# Patient Record
Sex: Female | Born: 1935 | Race: White | Hispanic: No | Marital: Married
Health system: Southern US, Community
[De-identification: ages and names within clinical notes are randomized; demographics above are authoritative.]

---

## 2006-12-13 ENCOUNTER — Ambulatory Visit (HOSPITAL_COMMUNITY): Admission: RE | Admit: 2006-12-13 | Discharge: 2006-12-14 | Payer: Self-pay | Admitting: Ophthalmology

## 2012-08-26 ENCOUNTER — Ambulatory Visit: Payer: Self-pay | Admitting: Unknown Physician Specialty

## 2012-08-26 LAB — APTT: Activated PTT: 23.9 secs (ref 23.6–35.9)

## 2012-08-26 LAB — URINALYSIS, COMPLETE
Nitrite: NEGATIVE
Protein: NEGATIVE
RBC,UR: 18 /HPF (ref 0–5)
Specific Gravity: 1.021 (ref 1.003–1.030)

## 2012-08-26 LAB — BASIC METABOLIC PANEL
Anion Gap: 18 — ABNORMAL HIGH (ref 7–16)
BUN: 21 mg/dL — ABNORMAL HIGH (ref 7–18)
Co2: 28 mmol/L (ref 21–32)
Creatinine: 0.71 mg/dL (ref 0.60–1.30)

## 2012-08-26 LAB — CBC
HCT: 40.3 % (ref 35.0–47.0)
HGB: 13.5 g/dL (ref 12.0–16.0)
MCH: 31.4 pg (ref 26.0–34.0)
MCHC: 33.4 g/dL (ref 32.0–36.0)

## 2012-08-26 LAB — PROTIME-INR
INR: 0.9
Prothrombin Time: 12.4 secs (ref 11.5–14.7)

## 2012-09-15 ENCOUNTER — Inpatient Hospital Stay: Payer: Self-pay | Admitting: Unknown Physician Specialty

## 2012-09-15 LAB — ELECTROLYTE PANEL
Chloride: 111 mmol/L — ABNORMAL HIGH (ref 98–107)
Co2: 30 mmol/L (ref 21–32)

## 2012-09-15 LAB — HEMOGLOBIN: HGB: 9.9 g/dL — ABNORMAL LOW (ref 12.0–16.0)

## 2012-09-16 LAB — BASIC METABOLIC PANEL
BUN: 12 mg/dL (ref 7–18)
Chloride: 107 mmol/L (ref 98–107)
Creatinine: 0.67 mg/dL (ref 0.60–1.30)
EGFR (Non-African Amer.): >60
Glucose: 205 mg/dL — ABNORMAL HIGH (ref 65–99)
Potassium: 4.2 mmol/L (ref 3.5–5.1)
Sodium: 138 mmol/L (ref 136–145)

## 2012-09-16 LAB — HEMOGLOBIN: HGB: 9.1 g/dL — ABNORMAL LOW (ref 12.0–16.0)

## 2012-09-17 LAB — CBC WITH DIFFERENTIAL/PLATELET
Basophil #: 0 10*3/uL (ref 0.0–0.1)
HCT: 23.9 % — ABNORMAL LOW (ref 35.0–47.0)
Lymphocyte #: 0.7 10*3/uL — ABNORMAL LOW (ref 1.0–3.6)
Lymphocyte %: 6.7 %
MCHC: 33.9 g/dL (ref 32.0–36.0)
Monocyte #: 0.8 x10 3/mm (ref 0.2–0.9)
Platelet: 130 10*3/uL — ABNORMAL LOW (ref 150–440)
RDW: 12.9 % (ref 11.5–14.5)

## 2012-09-17 LAB — HEMOGLOBIN: HGB: 7.8 g/dL — ABNORMAL LOW (ref 12.0–16.0)

## 2012-09-17 LAB — PATHOLOGY REPORT

## 2012-09-18 LAB — HEMOGLOBIN: HGB: 8.5 g/dL — ABNORMAL LOW (ref 12.0–16.0)

## 2014-07-21 IMAGING — CR DG KNEE COMPLETE 4+V*L*
1 series · 4 of 4 positions shown · non-contrast
Comparison: none

REASON FOR EXAM: SWELLING AND PAIN TO L KNEE
COMMENTS:

[Series 1: x knee ap left · 0.14mm/px · 4 of 4 slices shown]
[im 1/4]
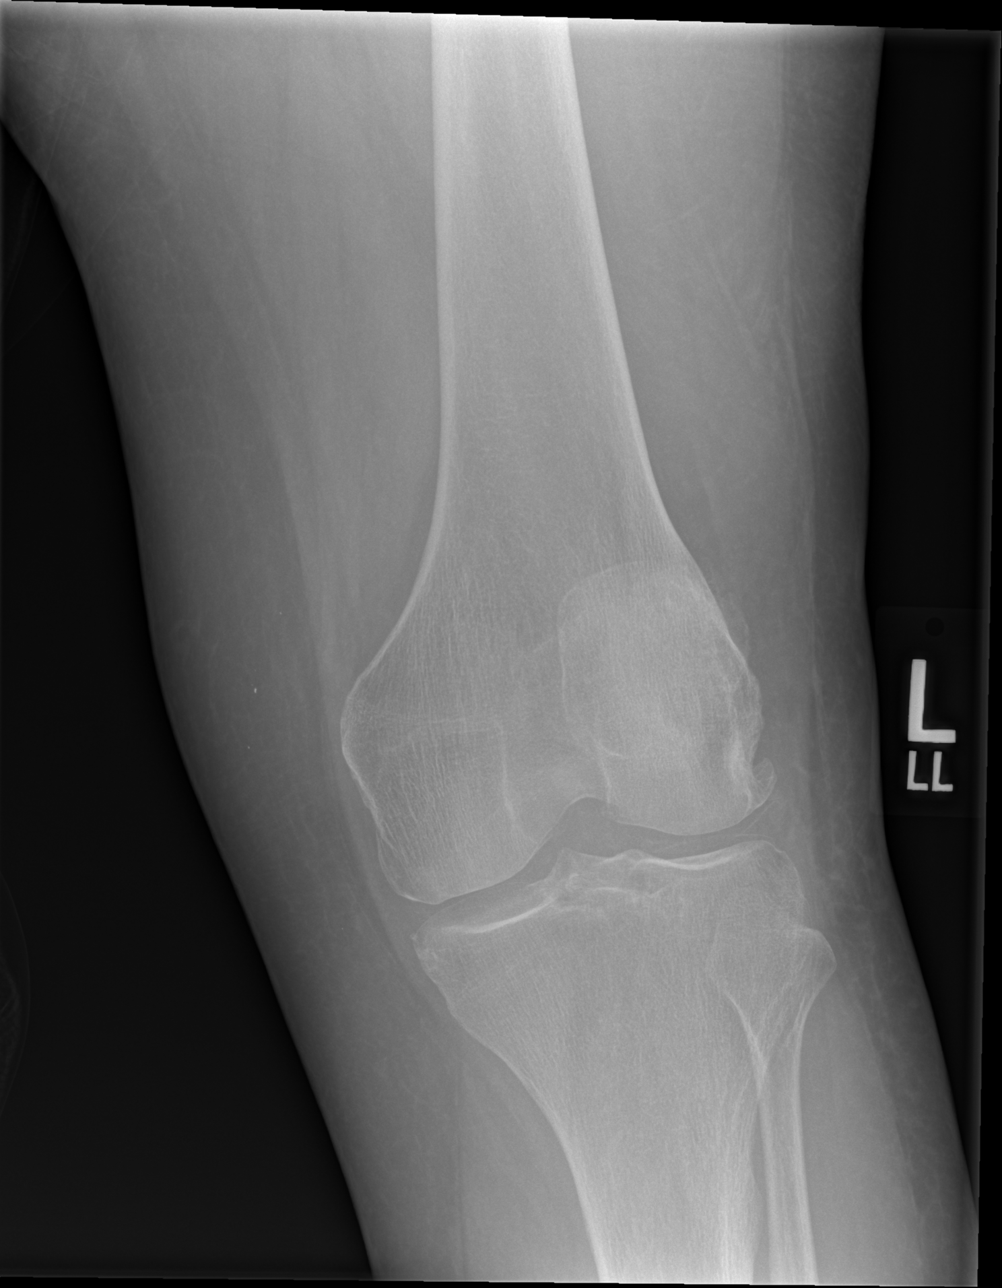
[im 2/4]
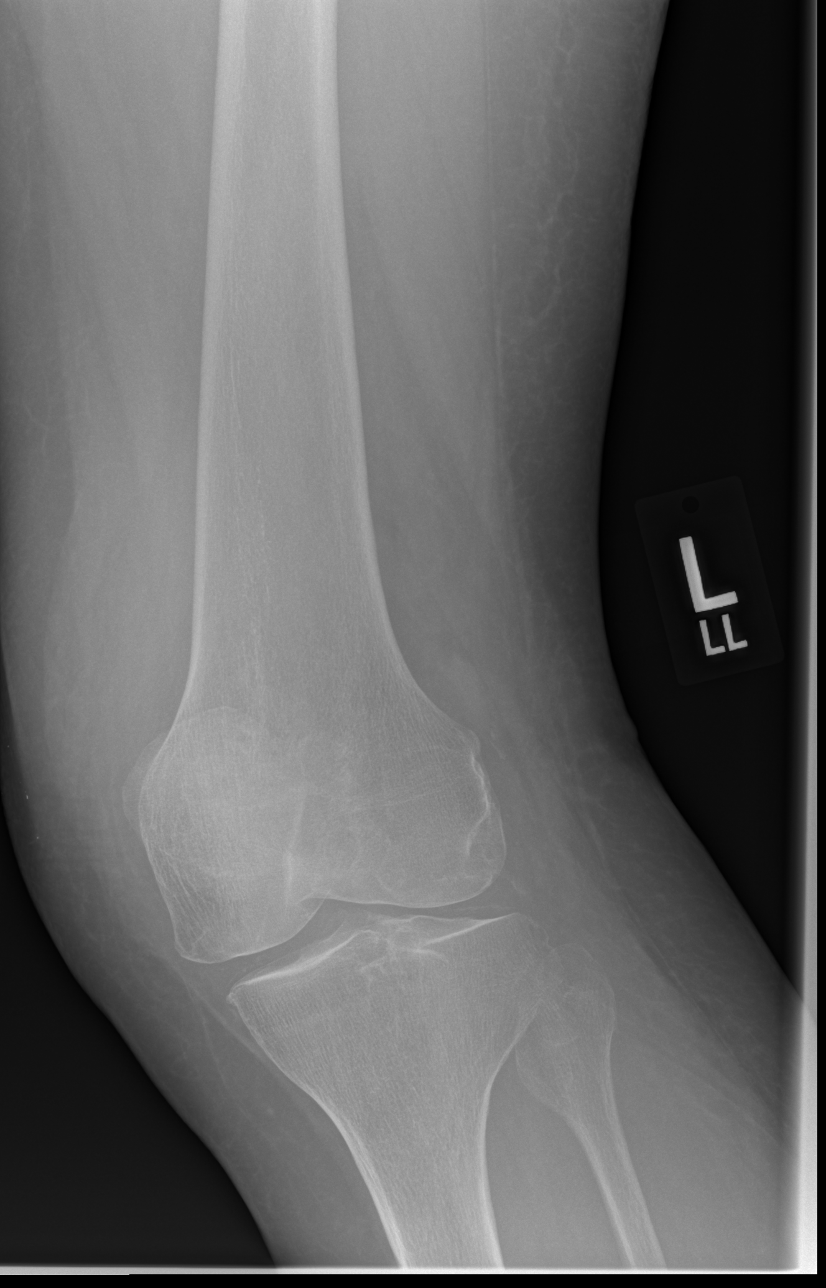
[im 3/4]
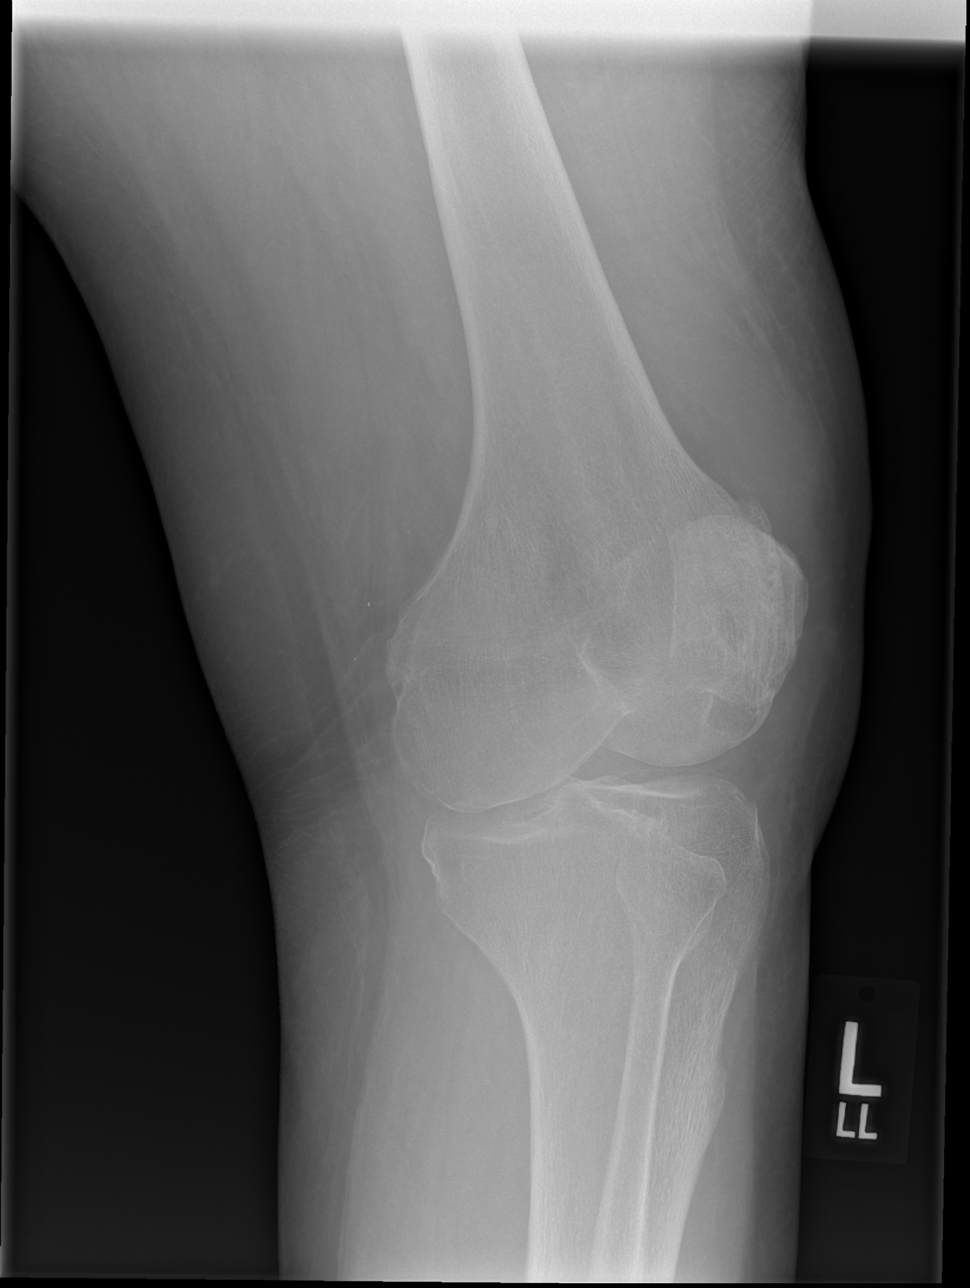
[im 4/4]
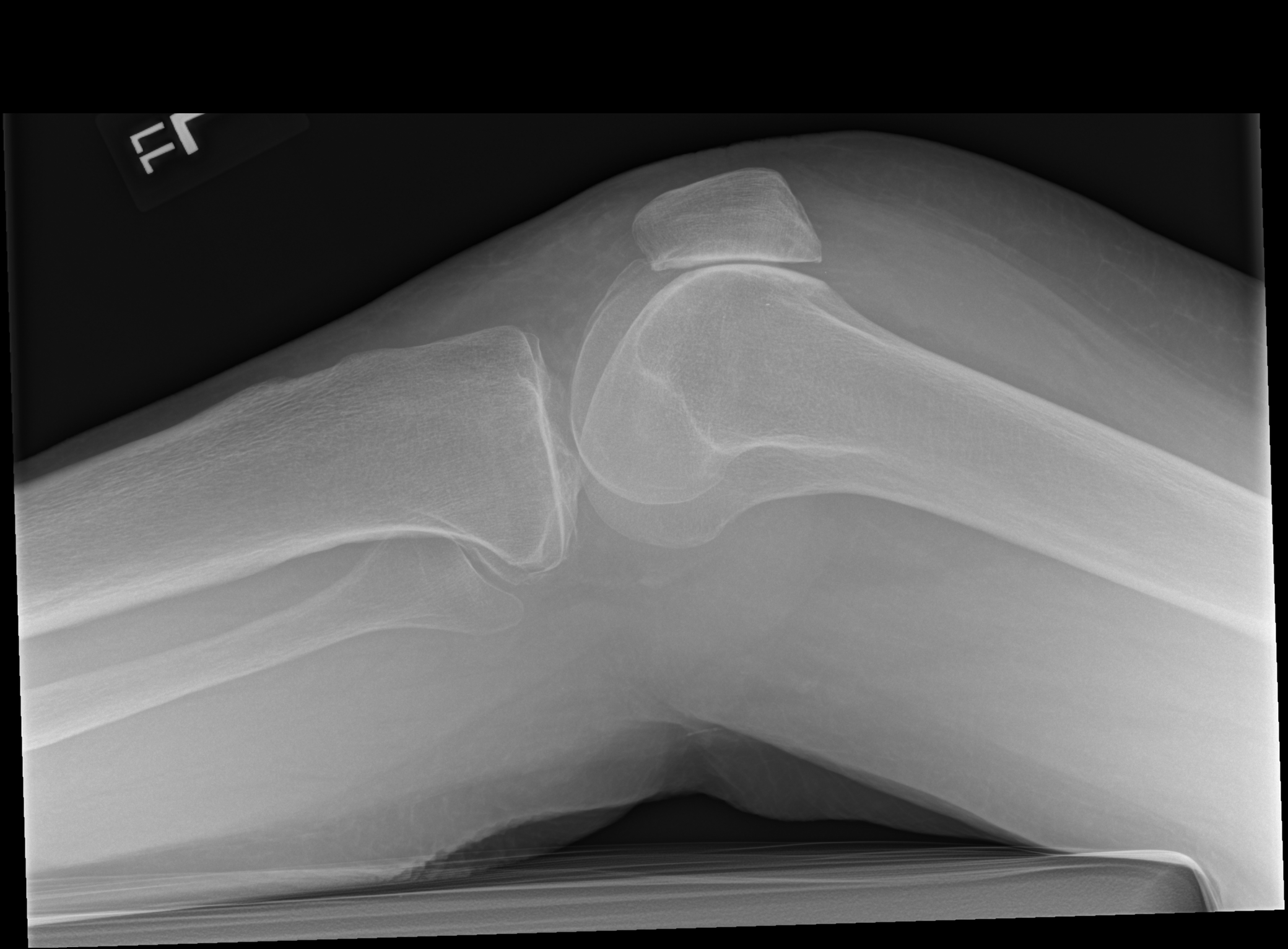

[4 of 4 positions shown; findings below may reference images not displayed]

PROCEDURE:     DXR - DXR KNEE LT COMP WITH OBLIQUES  - September 17, 2012  [DATE]

RESULT:     Four views of the left knee are submitted. The bones are
osteopenic. There is no evidence of an acute fracture. No definite joint
effusion is evident. No more than mild degenerative changes are
demonstrated. There is mild diffuse soft tissue swelling.
IMPRESSION: There is no acute bony abnormality of the left knee.

[REDACTED]

## 2014-07-21 IMAGING — US US EXTREM LOW VENOUS*L*
1 series · 14 of 24 positions shown · non-contrast
Comparison: none

REASON FOR EXAM: left knee effusion, pain w/ ROM, s/p L THA, limited
mobility
COMMENTS:

[Series 1: us extrem low venous*left* · 0.11mm/px · 14 of 24 slices shown]
[im 1/24]
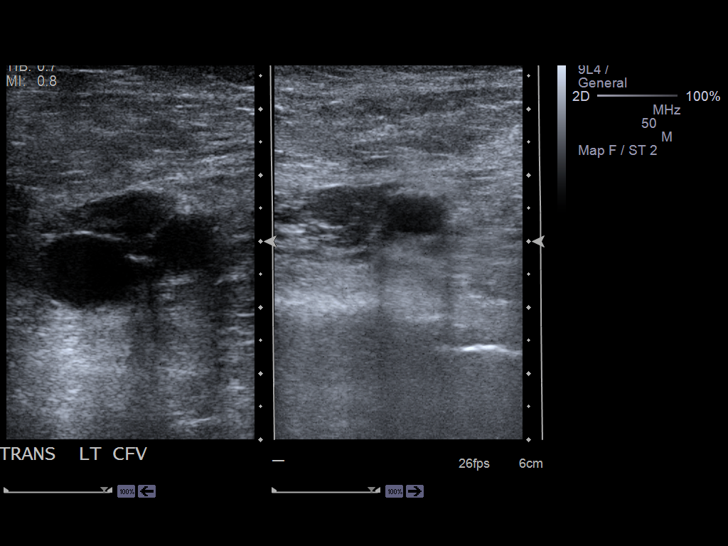
[im 3/24]
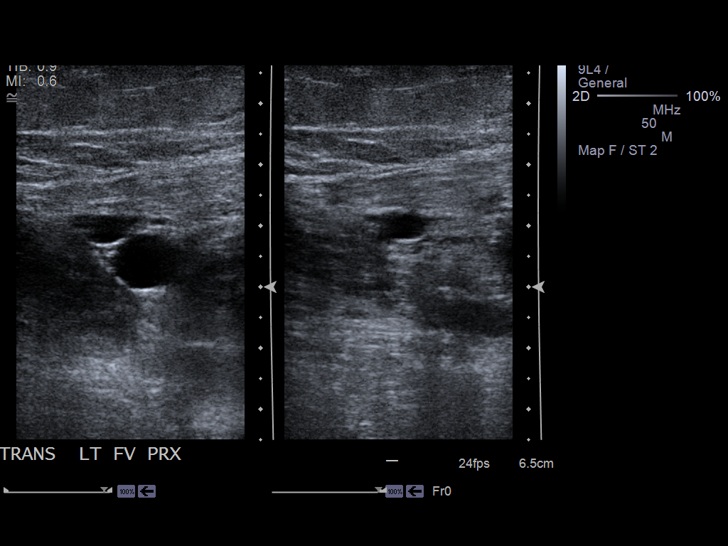
[im 5/24]
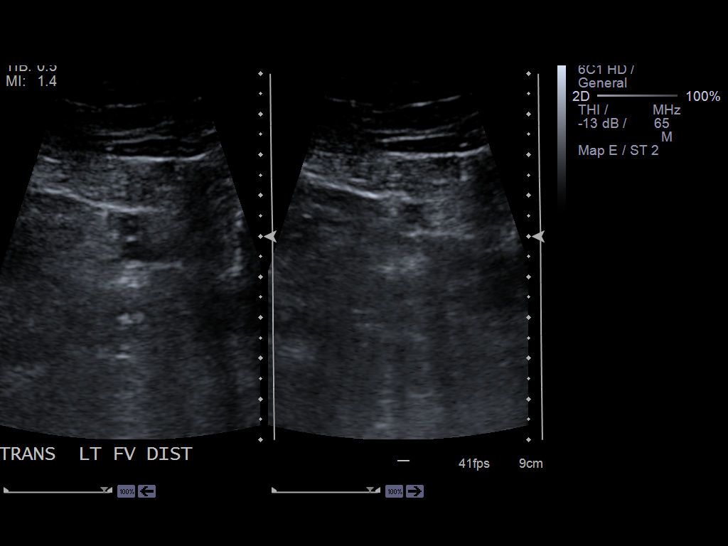
[im 7/24]
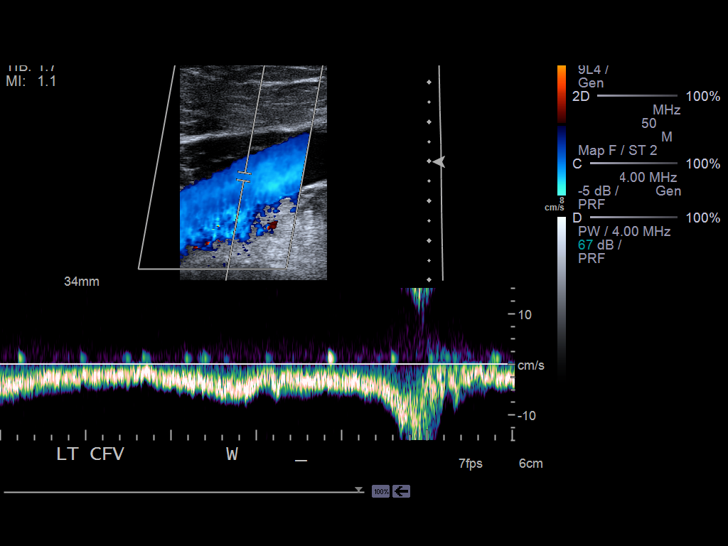
[im 8/24]
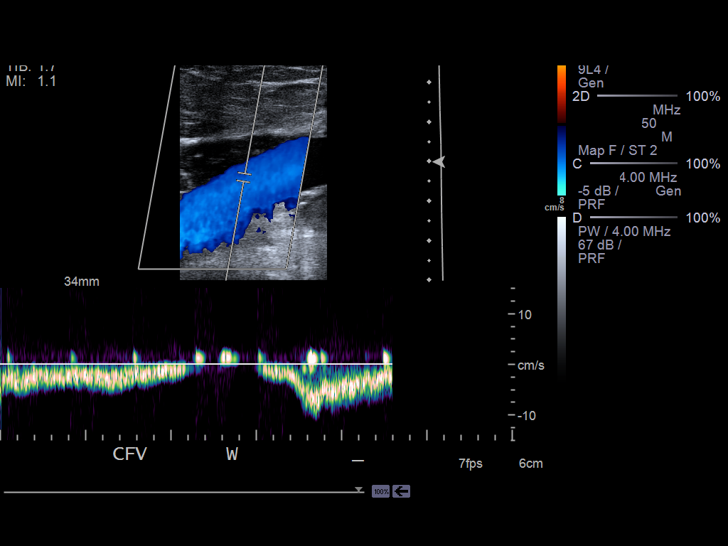
[im 10/24]
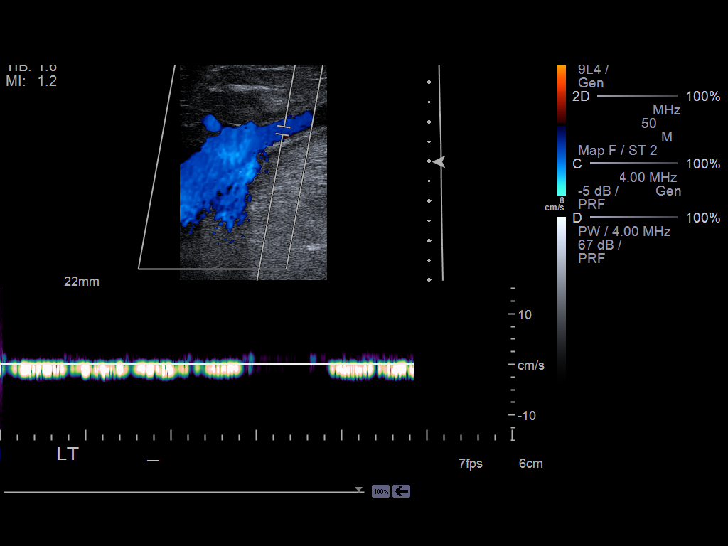
[im 12/24]
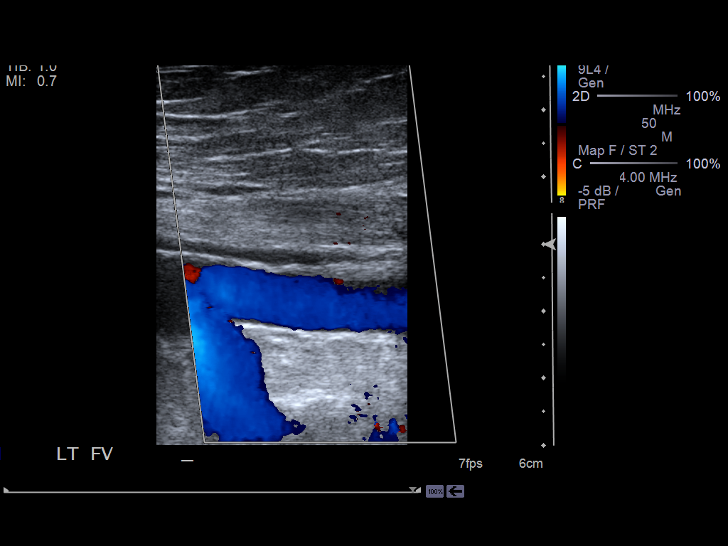
[im 13/24]
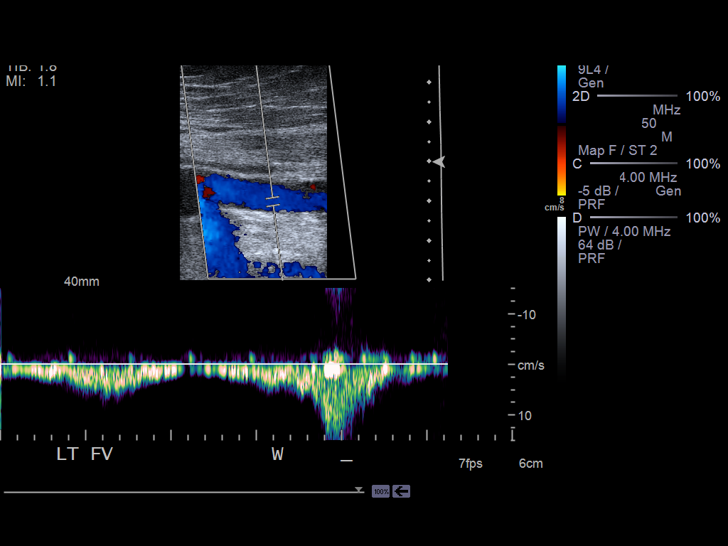
[im 15/24]
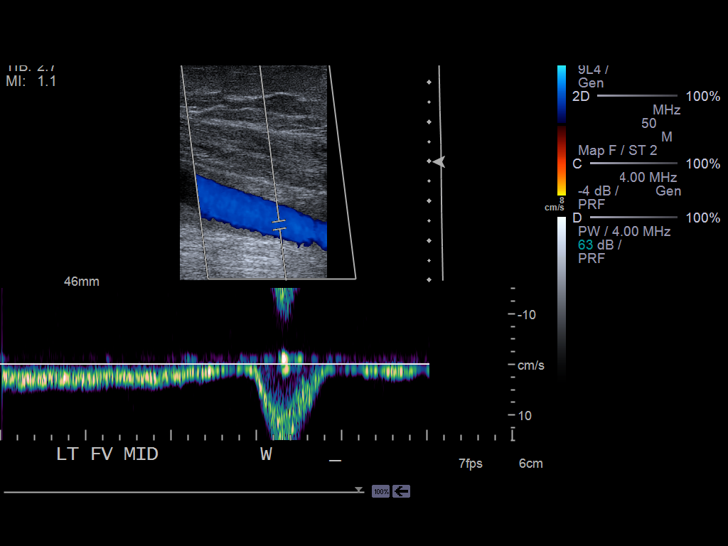
[im 17/24]
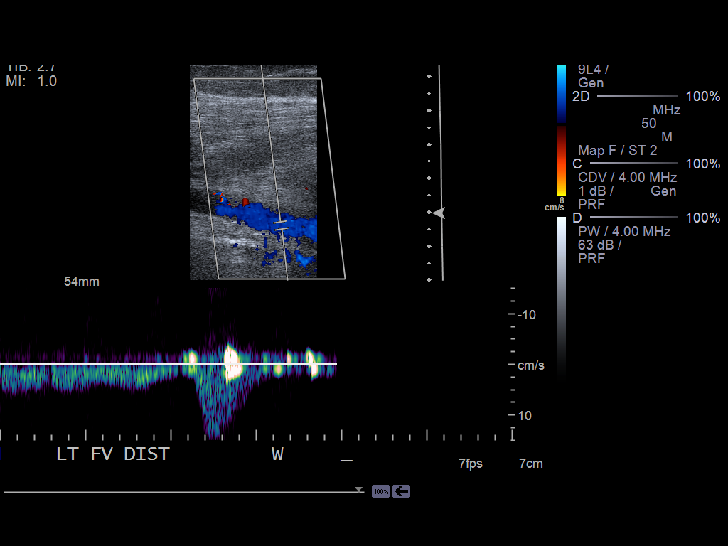
[im 19/24]
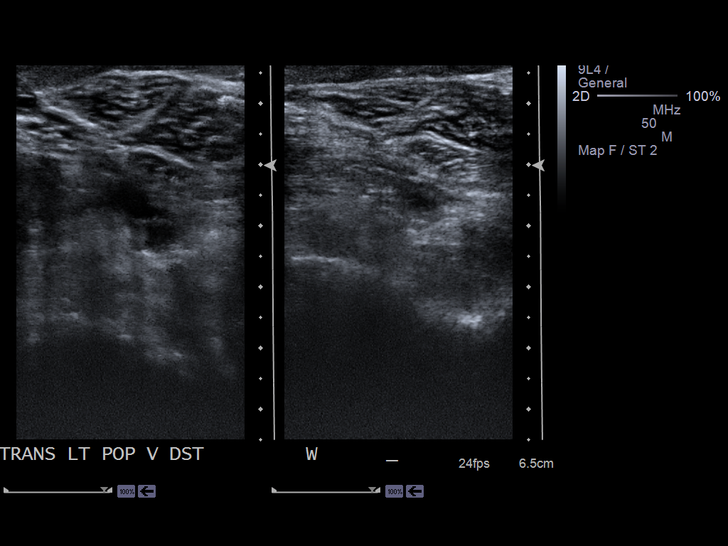
[im 20/24]
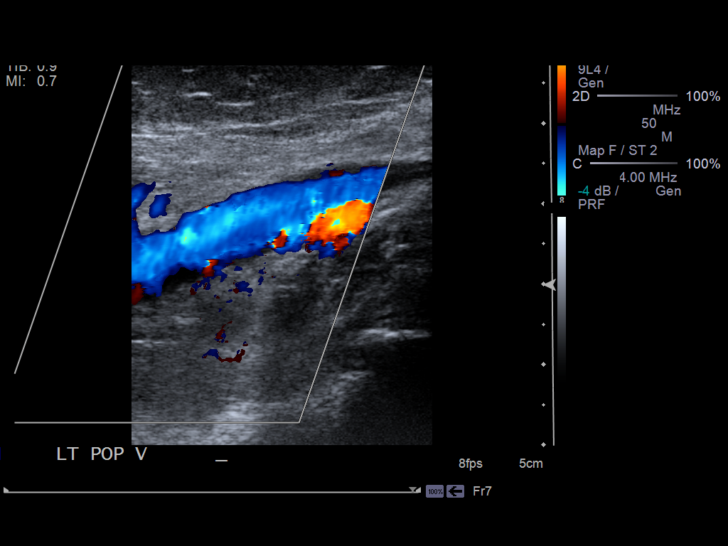
[im 22/24]
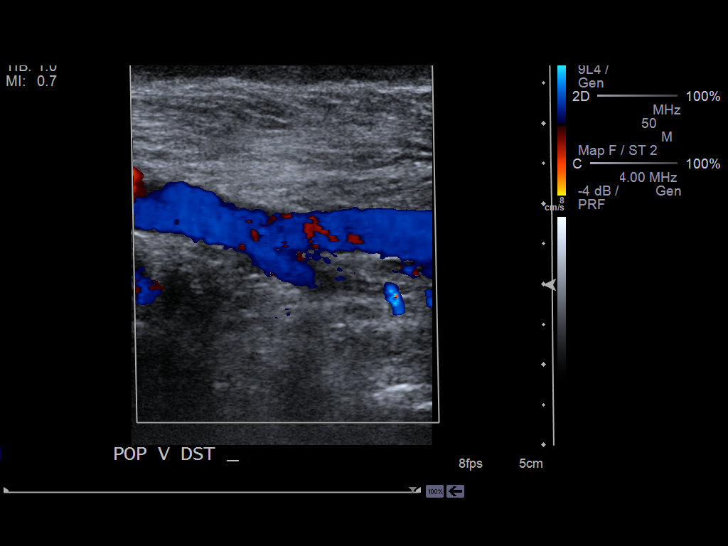
[im 24/24]
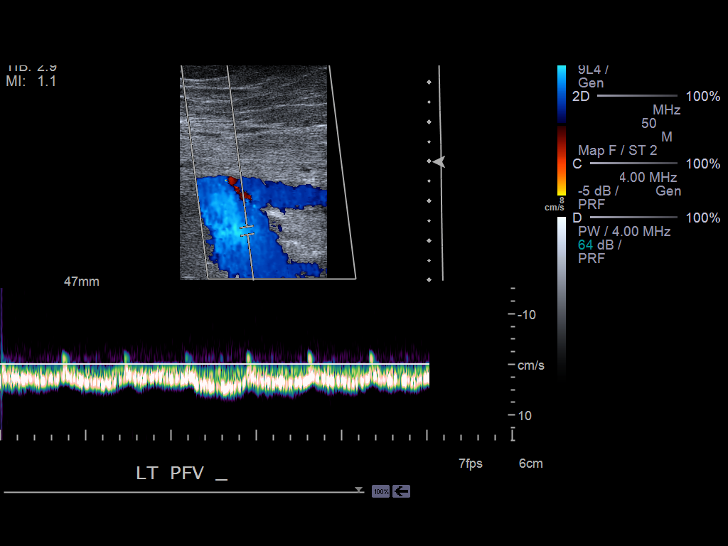

[14 of 24 positions shown; findings below may reference images not displayed]

PROCEDURE:     US  - US DOPPLER LOW EXTR LEFT  - September 17, 2012  [DATE]

RESULT:     The deep venous system of the left lower extremity was
interrogated with grayscale and color flow Doppler techniques.

The left common femoral, superficial femoral, and popliteal veins are
normally compressible. The waveform patterns are normal and the color flow
images are normal. The response to the augmentation and Valsalva maneuvers
is normal.
IMPRESSION: There is no evidence of thrombus within the left femoral or
popliteal veins.

[REDACTED]

## 2015-01-18 NOTE — Consult Note (Signed)
PATIENT NAME:  Amanda Frank, Amanda Frank MR#:  [id number] DATE OF BIRTH:  12/16/1928  DATE OF CONSULTATION:  09/16/2012  REFERRING PHYSICIAN:  Dr. Erin Sons. CONSULTING PHYSICIAN:  Starleen Arms, MD  PRIMARY CARE PHYSICIAN:  [facility].    REASON FOR CONSULTATION:  Low urinary output.  HISTORY OF PRESENT ILLNESS: This is an 81 year old female with significant past medical history of hyperlipidemia, hypothyroidism, arthritis, depression, osteoporosis, who presents  today for elective right total hip replacement. The patient underwent procedure, which went without complication.  The patient had estimated blood loss of surgery around 750 mL. The patient was initially hypotensive upon presentation to the floor with systolic blood pressure in the low 90s but which resolved after fluid bolus and blood pressure was acceptable after that. Medical consult was called for low urinary output. The patient had 250 mL output in the last 8 hours which is around 31 mL per hour. The patient since initial episode of hypotension has not been hypotensive with acceptable blood pressure, tolerating 75 mL per hour of normal saline. The patient denies any lightheadedness or dizziness. The patient has indwelling Foley catheter. The patient's hemoglobin as an outpatient was 13.9, postop on repeat was 9.9. Denies any chest pain, any shortness of breath, palpitations, lightheadedness or dizziness.   PAST MEDICAL HISTORY: 1.  Hypothyroidism.  2.  Carpal tunnel syndrome.  3.  Communicating hydrocephalus.  4.  Depression.  5.  Thrombocytopenia.  6.  Hyperlipidemia.   PAST SURGICAL HISTORY: 1.  VP shunt.  2.  Right carpal tunnel release.  3.  Cataract surgery.  4.  Cervical spine fusion.   FAMILY HISTORY: Significant for congestive heart failure and kidney disease in the family.   SOCIAL HISTORY:  1.  Former tobacco abuse, no recent tobacco use.  2.  No alcohol or illicit drug use.   ALLERGIES:  1.   ETODOLAC. 2.  SULFA DRUGS.   HOME MEDICATIONS: 1.  Ambien 10 mg, 1/2 tablet at bedtime.  2.  Fish oil 1000 mg daily.  3.  Glucosamine chondroitin complex 2 tablets b.i.d.  4.  Crestor 20 mg, 1/2 tablet daily.  5.  Synthroid 125 mcg daily.  6.  Nasonex each nostril as needed.  7.  Ergocalciferol 5000 units weekly.  8.  Methocarbamol 500 mg oral 4 to 5 times a day.  9.  Vitamin E complex 1 capsule daily.  10.  Vitamin B12, 1 capsule daily.  11.  Colace as needed.  12.  Fiber Complete daily.  13.  Folic acid.  14.  Aleve.  15.  Vitamin C __________.  16.  Hydrocodone/acetaminophen 2.5/500, 1 every 12 hours.  17.  Lexapro 10 mg daily.  18.  Calcium 600 with vitamin D 2 tablets daily.   REVIEW OF SYSTEMS:  CONSTITUTIONAL:  The patient denies any fever, fatigue or weakness.  EYES: Denies blurry vision, double vision or pain.  ENT: Denies tinnitus, ear pain, hearing loss.  RESPIRATORY: Denies cough, wheezing, hemoptysis.  CARDIOVASCULAR: Denies chest pain, orthopnea, edema, shortness of breath, palpitations or syncope.  GASTROINTESTINAL: Denies nausea, vomiting, diarrhea, abdominal pain.  GENITOURINARY: Denies dysuria, hematuria, renal colic. Has indwelling Foley. ENDOCRINE: Denies polyuria, polydipsia, heat or cold intolerance.  INTEGUMENTARY: Denies acne, rash or lesions.  MUSCULOSKELETAL: Has arthritis.  NEURO:  Denies ataxia, dementia, headaches.  PSYCHIATRIC: Denies anxiety, insomnia, schizophrenia or nervousness.   PHYSICAL EXAMINATION: VITAL SIGNS: Temperature 98, pulse 78, respiratory rate 18, blood pressure 119/63, saturating 95% on room air.  GENERAL:  Well-nourished female, looks comfortable in bed, in no apparent distress.  HEENT: Head atraumatic, normocephalic. Pupils equal, reactive to light. Pink conjunctivae. Anicteric sclerae. Moist oral mucosa.  NECK: Supple. No thyromegaly. No JVD.  CHEST: Good air entry bilaterally, clear to auscultation. No wheezing, rales,  rhonchi.  CARDIOVASCULAR: S1, S2 heard. No rubs, murmur or gallops.  ABDOMEN: Soft, nontender, nondistended. Bowel sounds present.  EXTREMITIES: Has no edema with right total hip replacement surgery.  PSYCHIATRIC: Appropriate affect. Awake, alert x 3. Intact judgment and insight. Pleasant.  NEUROLOGIC: Cranial nerves grossly intact.  MOTOR: No focal deficits.   PERTINENT LABORATORY DATA: Sodium 143, potassium 4.4, chloride 111, CO2 30, anion gap 2. Hemoglobin 9.9.   ASSESSMENT AND PLAN: An 81 year old female status post right total hip replacement on December 16th. Medical consult was called for low urine output. The patient initially was hypotensive on the floor, responded to 500 mL fluid bolus, hypotensive with systolic blood pressure in the low 90s resolved after the fluid bolus. Since then, blood pressure has been acceptable. The patient had 250 mL urine output over the last 8 hours which is averaging more than 30 mL per hour.  1.  Low urine output.  Patient has a clear lung so we will have her bolused with 250 mL of normal saline.  Will monitor her urine output every 1 hour and, if no improvement, will have her receiving another bolus of 250, but in general her urine output is acceptable. Will check her hemoglobin and BMP in a.m. We will monitor closely to be sure there is no significant drop in her hemoglobin or in her blood pressure.   2.  Anemia. This is due to acute blood loss from surgery. Will start on iron supplements.  3.  Hypothyroidism. Continue with Synthroid.  4.  Hyperlipidemia. Continue with statin.  5. Deep vein thrombosis prophylaxis. The patient is on subcutaneous Lovenox.   TOTAL TIME SPENT ON MEDICAL CONSULT: 45 minutes.      ____________________________ Starleen Armsawood S. Damarien Nyman, MD dse:cs D: 09/16/2012 02:57:20 ET T: 09/16/2012 19:06:23 ET JOB#: [id number]  cc: Starleen Armsawood S. Cass Vandermeulen, MD, <Dictator> Ollivander See Teena IraniS Koralyn Prestage MD ELECTRONICALLY SIGNED 09/24/2012 12:21

## 2015-01-18 NOTE — Op Note (Signed)
PATIENT NAME:  Amanda Frank, Amanda Frank MR#:  [id number] DATE OF BIRTH:  11-17-1928  DATE OF PROCEDURE:  09/15/2012  PREOPERATIVE DIAGNOSIS:  Severe degenerative arthritis, right hip.   POSTOPERATIVE DIAGNOSIS:  Severe degenerative arthritis, right hip.   OPERATION:  Stryker total hip replacement on the right.   SURGEON:  Alda BertholdHarold B Isom Kochan, Jr., M.D.   FIRST ASSISTANT:  April Berndt, NP   ANESTHESIA:  General.   HISTORY:  The patient had a long history of right hip pain. 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 The patient was ultimately brought in for a total hip replacement due to her persistent symptoms.   DESCRIPTION OF PROCEDURE:  The patient was taken to the operating room where satisfactory spinal anesthesia was achieved. The patient was turned to the lateral decubitus position with the right hip up. The right hip was prepped and draped in the usual fashion for a procedure about the hip. The patient incidentally was given 2 g Kefzol IV prior to the start of the procedure.   A slightly curved posterior incision was made. It was centered over the greater trochanter. Dissection was carried down through the subcutaneous tissue onto the gluteus maximus fascia and fascia lata. They were divided in line with the incision. A Charnley retractor was inserted into the wound. Care was taken to protect the sciatic nerve.   The external rotators were divided at their attachment to the greater trochanter and reflected over the sciatic nerve. The capsule was divided in a T-shaped fashion. The hip was dislocated. The femoral head was quite ovoid in nature with no perceptible evidence of any remaining normal articular cartilage.   I osteotomized the femoral neck about a fingerbreadth above the lesser trochanter. I used the Secur-Fit neck cutting guide to make the appropriate cut.   With adequate retraction, the acetabulum was prepared. I basically reamed it to accommodate a  54 mm acetabular shell. Care was taken to not penetrate medially since the patient had a protrusio. Of course, the patient had an elongated malformed acetabulum.   After reaming, I went ahead and impacted a 54 mm cluster acetabular shell into the acetabulum. It was placed about 45 degrees of abduction and about 15 degrees of forward flexion. It was additionally secured with two 6.5 mm cancellus screws. A trial liner was inserted.   I next went ahead and reamed and broached the proximal femur to accommodate a #7 Secur-Fit femoral component. Attempts at a trial reduction were performed with a 0 anatomic femoral head. I had to go down to about a -5 head to get close to reduction. I could not reduce the trial into the acetabular cup, so I went ahead and resected about 5 more millimeters of bone from the calcar. With the #7 trial in place, I was able to reduce the hip with a femoral head with a -2.5 mm offset.   I went ahead and removed the trials. The permanent 0 degree, 40 mm polyethylene insert was impacted into the acetabular shell. I then impacted the 127 degree neck angle #7 Secur-Fit hip stem into the proximal femur. I then impacted the 40 mm anatomic femoral head with a -2.5 mm offset onto the neck of the femoral stem and then reduced it. The hip moved well and was quite stable.   The wound was irrigated with GU irrigant. The external rotators were reattached to the greater trochanter through drill holes with #2 ORTHOCORD sutures. I had previously released a portion  of the gluteus maximus attachment to the proximal femur. This release was repaired with #1 Ethibond sutures. Two Hemovac tubes were inserted in the depths of the wound. I then repaired the gluteus maximus fascia and fascia lata with #1 Ethibond and #1 Vicryl sutures, and the subcutaneous with 0 and 2-0 Vicryl, and the skin with skin staples.   A puncture wound was made proximal to the incision for a smooth Steinmann pin. It was bent at right  angles to help measure leg length. The pin, of course, was removed at the conclusion of the procedure and the puncture was closed with 3-0 nylon in a vertical mattress fashion.   I applied Betadine to the wound and then applied 4 TENS pads. A sterile dressing was applied.   The patient was turned supine and awakened and then transferred to her hospital bed.   She was taken to the recovery room in satisfactory condition.   ESTIMATED BLOOD LOSS:  About 850 mL of blood. No blood was transfused during the course of the procedure.   ____________________________  Alda Berthold., MD hbk:si D: 09/16/2012 08:47:00 ET T: 09/16/2012 20:14:29 ET JOB#: [id number]  cc: Alda Berthold., MD, <Dictator> Randon Goldsmith, Montez Hageman MD ELECTRONICALLY SIGNED 09/18/2012 18:31

## 2015-01-18 NOTE — Op Note (Signed)
PATIENT NAME:  Amanda Frank, Amanda Frank MR#:  [id number] DATE OF BIRTH:  1929/06/12  DATE OF PROCEDURE:  09/15/2012  Addendum  SUMMARY OF IMPLANTS USED:  A 54 mm cluster acetabular shell with two 6.5 mm x 30 mm cancellous screws, a 0-degree polyethylene insert with an internal diameter of 40 mm, a 127-degree neck angle Secur-Fit hip stem, size 7, with a 30 mm neck length and a 40 mm anatomic femoral head with a -2.5 mm offset    ____________________________ Alda BertholdHarold B. Mckaylie Vasey Jr., MD hbk:si D: 09/16/2012 08:50:00 ET T: 09/16/2012 20:53:27 ET JOB#: [id number]  cc: Alda BertholdHarold B. Salaam Battershell Jr., MD, <Dictator> Alda BertholdHAROLD B Ezeriah Luty, JR MD ELECTRONICALLY SIGNED 09/18/2012 18:33

## 2015-01-21 NOTE — Discharge Summary (Signed)
PATIENT NAME:  Amanda Frank, Amanda Frank MR#:  [id number] DATE OF BIRTH:  1928/11/11  DATE OF ADMISSION:  09/15/2012 DATE OF DISCHARGE:  09/19/2012  ADMITTING DIAGNOSIS: Right total hip arthroplasty.  DISCHARGE DIAGNOSIS: Right total hip arthroplasty.  OPERATION: On 09/15/2012, she had a right hip arthroplasty.   SURGEON: Neva Seat, MD  ANESTHESIA: Spinal.   ESTIMATED BLOOD LOSS: 750 mL.  OPERATIVE FINDINGS: Severe degenerative arthritis.   DRAINS: Hemovac.   IMPLANTS: Stryker.   COMPLICATIONS: None.   IMPLANTS USED: A 54 mm cluster acetabular shell with two 6.5 x 30 mm cancellus screws and 0 degree polyethylene insert with internal diameter 44 mm and 127 degree neck angle Secur-Fit hip stem size 7 with a 38 mm neck length and a 40 mm anatomical femoral head with a -2.5 mm offset.   The patient was stabilized and brought to the recovery room and then brought to the orthopedic floor where she was treated by physical therapy and for pain control.   HISTORY: Amanda Frank is an 81 year old female with years of right hip pain. She consented to right total hip arthroplasty for pain relief. She was admitted on 09/15/2012 and had surgery that same day.   PHYSICAL EXAMINATION: Well-developed, well-nourished. Alert and oriented x 3. No acute distress. Sensation intact to the right lower extremity. Heart regular rate and rhythm. Lungs clear to auscultation. Musculoskeletal-pain is reproduced with internal and external rotation of the right hip. She is neurovascularly intact to the right lower extremity. Skin is intact to right hip and lower extremity.   HOSPITAL COURSE: After admission on 09/15/2012, the patient had surgery the same day. She had good pain control afterwards and was brought to the orthopedic floor from the PACU. She had initial hemoglobin of 10.5, on postoperative day one. She developed a left knee effusion that was quite painful that day and had difficulty participating with physical  therapy where she was able to get up from the bed to the chair. On postoperative day two, 09/16/2012, hemoglobin was 9.1. She was unable to participate in physical therapy this day because of her left knee pain. She had lower extremity Doppler which was negative for DVT and a left knee x-ray which had no concern for acute fracture. Her Hemovac drain was removed on day two, as well as her Foley and IVs. On postoperative day three, 09/17/2012, she had hemoglobin of 7.8 and was also slightly hypotensive. One unit of packed red cells was transfused and with a repeat hemoglobin of 8.1. On postoperative day four, 09/18/2012, her hemoglobin was 8.5. She is complaining of right hip pain. She is stable to be discharged.   CONDITION AT DISCHARGE: Stable with left knee effusion.   DISPOSITION: The patient was sent to rehab in [place].   DISCHARGE INSTRUCTIONS: The patient will follow up with [facility] in 2 weeks for staple removal. She will do physical therapy and may be weight-bearing as tolerated, on the right lower extremity. She is on a regular diet. She will use knee-high TED hose bilaterally. Her right hip dressing can be changed once daily and as needed.   DISCHARGE MEDICATIONS:  1. Ambien 10 mg half tab p.o. at bedtime. 2. Calcium plus D 600 mg 2 tabs p.o. with breakfast.  3. Colace 100 mg 4 caps p.o. at bedtime.  4. Crestor 10 mg 1 tab p.o. q. a.m.  5. Ergocalciferol 50,000 international units one cap p.o. q. Friday a.m.  6. Escitalopram 10 mg 1 tab p.o. q. a.m.  7. Fiber complete 5 tabs p.o. at bedtime.  8. Fish oil 1000 mg 1 cap p.o. at bedtime.  9. Folic acid 400 mg 1 tab p.o. q. a.m.  10. Glucosamine chondroitin 1500/200 mg 2 tabs p.o. q. a.m. and q. p.m.  11. Nasonex 50 mcg/inhalation nasal spray 2 sprays to nostril q. day p.r.n.  12. Synthroid 125 mcg 1 tab p.o. q. a.m.  13. Vitamin B complex 1 tab p.o. q. a.m.  14. Vitamin C 1000 mg 1 tab p.o. at bedtime.  15. Vitamin E  1000 mg 1 tab p.o. at bedtime.  16. Oxycodone 5 mg tablet 1 tab p.o. q. 3 to 4 hours p.r.n. pain.  17. Acetaminophen 500 mg 1 tab p.o. q. 4 hours p.r.n. pain or temperature greater than 100.4.  18. Ferrous sulfate 325 mg 1 tab p.o.  19. Polyethylene glycol 3350 oral powder 17 grams p.o. q. day p.r.n. constipation.  20. Enoxaparin 30 mg subcutaneous daily.  21. Ondansetron 4 mg p.o. q. 6 hours p.r.n. nausea or vomiting.  ____________________________ Kosisochukwu Goldberg M. Haskel KhanBerndt, NP amb:sb D: 09/19/2012 08:02:00 ET T: 09/19/2012 08:32:02 ET JOB#: [id number]  cc: Dmari Schubring M. Haskel KhanBerndt, NP, <Dictator> Burt EkAPRIL M Paije Goodhart FNP ELECTRONICALLY SIGNED 10/17/2012 9:24

## 2024-04-27 NOTE — ED Provider Notes (Signed)
 Emergency Department Provider Note    ED Clinical Impression   Final diagnoses:  Pain of right hip (Primary)  Hip effusion, right    ED Assessment/Plan    Condition: Stable Disposition: Discharge  This chart has been completed using Dragon Medical Dictation software, and while attempts have been made to ensure accuracy, certain words and phrases may not be transcribed as intended.   History   Chief Complaint  Patient presents with  . Leg Pain   HPI  Amanda Frank is a 89 y.o. female  who presents today to the  emergency department complaining of right hip/mid thigh pain for the last week.  Patient states that pain got worse last night.  Pain is worse with weightbearing.  Patient has a history of prior hip surgery and associated issues intermittently for some time now.  She denies any trauma.  She describes her symptoms as moderate.  She denies any cardiopulmonary symptoms.    Allergies: is allergic to sulfa (sulfonamide antibiotics), bisphosphonates, and etodolac. Medications: is not on any long-term medications. PMHx:  has a past medical history of Anxiety disorder, Disease of thyroid gland, and Vitamin D deficiency. PSHx:  has no past surgical history on file. SocHx:  reports that she has never smoked. She has never used smokeless tobacco. She reports that she does not drink alcohol and does not use drugs. Allergies, Medications, Medical, Surgical, and Social History were reviewed as documented above.   Social Drivers of Health with Concerns   Food Insecurity: Not on file  Transportation Needs: Unknown (02/11/2024)   Received from [facility]   OASIS A1250: Transportation   . Lack of Transportation (Medical): Not on file   . Lack of Transportation (Non-Medical): Not on file   . Patient Unable or Declines to Respond: Yes  Alcohol Use: Not on file  Housing: Not on file  Physical  Activity: Not on file  Utilities: Not on file  Stress: Not on file  Interpersonal Safety: Not on file  Substance Use: Not on file (04/27/2024)  Intimate Partner Violence: Not on file  Social Connections: Not on file  Financial Resource Strain: Not on file  Health Literacy: Unknown (02/11/2024)   Received from [facility]   OASIS B1300: Health Literacy   . Frequency of needing help to read materials from doctor or pharmacy: Patient unable to respond  Internet Connectivity: Not on file     Review Of Systems  Review of Systems  Constitutional:  Negative for fever.  HENT:  Negative for congestion.   Respiratory:  Negative for chest tightness and shortness of breath.   Cardiovascular:  Negative for chest pain.  Gastrointestinal:  Negative for abdominal pain.  Musculoskeletal:        Right hip pain.  Skin:  Negative for color change.  Psychiatric/Behavioral:  Negative for behavioral problems.   All other systems reviewed and are negative.   Physical Exam   BP 160/66   Pulse 65   Temp 36.5 C (97.7 F) (Oral)   Resp 17   Wt 68 kg (150 lb)   SpO2 99%   Physical Exam Vitals and nursing note reviewed.  Constitutional:      General: She is not in acute distress. HENT:     Head: Normocephalic.  Eyes:  Conjunctiva/sclera: Conjunctivae normal.  Cardiovascular:     Rate and Rhythm: Regular rhythm.     Pulses: Normal pulses.     Heart sounds: Normal heart sounds.  Pulmonary:     Effort: No respiratory distress.     Breath sounds: Normal breath sounds.  Abdominal:     General: There is no distension.  Musculoskeletal:        General: Tenderness present. No deformity.     Comments: There is tenderness of the right inner hip/thigh.  There is pain with flexion of the hip.  No deformity appreciated.  Patient has strong femoral, popliteal, dorsalis pedis and posterior tibial pulses.  Normal cap refill.  Skin:    General: Skin is warm.     Capillary Refill: Capillary  refill takes 2 to 3 seconds.     Comments: Normal cap refill.  Neurological:     General: No focal deficit present.     Mental Status: She is oriented to person, place, and time.     Cranial Nerves: No cranial nerve deficit.     Sensory: No sensory deficit.  Psychiatric:        Mood and Affect: Mood normal.     ED Course  Medical Decision Making Differential initially includes arthritis versus occult fracture versus less likely dislocation versus postop issues.  Will get plain films.  9:01 AM CT scan ordered given x-ray findings.  10:01 AM Patient's care discussed with Dr. Heyward.  He has reviewed CT images.  Recommends close follow-up with patient's primary orthopedist.  Recommends limited weightbearing until then.  Patient does see [facility] in [place].  She recently had some shots on Thursday.  Have discussed CT findings with patient and family.  Patient will be following up.  She lives at a local nursing facility which is great as she will get the care that she needs.  She does have a wheelchair already.  Patient is stable for discharge.  I have reviewed my clinical findings and studies and my clinical impression with the patient. The patient has expressed understanding that at this time there is no evidence for a more malignant underlying process, but the patient also understands that early in the process of a condition such as this, an initial workup can be falsely reassuring. I have counseled the patient and discussed follow-up with the patient, stressing the importance of appropriate follow-up. I have also counseled the patient to return if worse or any concerns. Routine discharge counseling was given to the patient and the patient understands that worsening, changing or persistent symptoms should prompt an immediate call or follow up with their primary physician or return to the emergency department for reevaluation. Patient has expressed understanding.     Problems  Addressed: Hip effusion, right: acute illness or injury that poses a threat to life or bodily functions Pain of right hip: acute illness or injury  Amount and/or Complexity of Data Reviewed Independent Historian: EMS Radiology: ordered. Decision-making details documented in ED Course.  Risk Prescription drug management. Parenteral controlled substances. Decision regarding hospitalization.     Procedures   No results found for this visit on 04/27/24 (from the past 4464 hours).   ED Results No results found for any visits on 04/27/24. CT Lower Extremity Right Wo Contrast Result Date: 04/27/2024 Exam:  CT of the Right  Lower Extremity without Contrast  History:  Persistent pain, question occult fracture.  Technique:  Standard CT of the right  lower extremity (hip) without venous contrast.  AEC (automated exposure control) and/or manual techniques such as size-specific kV and mAs are employed where appropriate to reduce radiation exposure for all CT exams.  Comparison:  Same day radiographs.  Findings:  There is extensive acetabular component periprosthetic lucency/resorption most pronounced at the anteromedial, with component-bone up to 1.8 cm at Charnley zones 2/3. Additionally, there is up to 4 mm lucency about the anterior acetabular bone screws, which further supports component loosening. There is additional greater than 2 mm lucency about the femoral stem in Gruen zones 1 and 7, though there is minimal periprosthetic loosening of the distal stem. At both the acetabulum and proximal right femur, there is no displaced fracture, noting moderate periarticular heterotopic ossification most pronounced at the superior and inferior pseudocapsular margins. No acute fracture of the partially imaged sacrum or right hemipelvis.  There is a moderate to large right hip joint effusion. The lower abdominopelvic and proximal right thigh muscle groups and tendons demonstrate normal contours. Iliofemoral  calcified atherosclerosis is noted. Within the partially imaged pelvis, catheter loops are present. There is moderate bladder distention.    1.    Extensive acetabular component loosening as described, with lucency/resorption most pronounced at the anteromedial Charnley zones 2/3. 2.    Additional lucency about the femoral stem in Gruen zones 1 and 7 in keeping with mild proximal femoral component loosening, though there is no significant periprosthetic loosening of the distal stem. 3.    No displaced fracture of the acetabulum or proximal right femur, noting moderate periarticular heterotopic ossification most pronounced at the superior and inferior pseudocapsular margins. 4.    Moderate to large right hip joint effusion.  Signed (Electronic Signature): 04/27/2024 8:55 AM Signed By: Thayer Kitty, MD  XR Hip 2 Views Right Result Date: 04/27/2024 Exam:  Right Hip   History:  Hip pain  Technique:  2 views  Comparison:  None.  FINDINGS/IMPRESSION:  1. Bilateral hip arthroplasties with components in good alignment, without hardware failure or loosening. 2. Exuberant heterotopic ossification about the right hip. 3. No obvious acute displaced fracture, subluxation, or suspicious bony lesion on this exam with decreased sensitivity for fine bony detail due to mild generalized osteopenia. If clinical concern for a subtle acute radiographically occult fracture persists consider CT. Consider outpatient DEXA scan also. 4. VP shunt seen terminating in the pelvis without kink or discontinuity. 5. Moderate distal colonic stool burden suspicious for constipation. Correlate clinically. 6. Mild dilatation of the proximal small bowel with normal caliber colon, likely ileus versus, less likely, low-grade obstruction of the proximal small bowel.  Signed (Electronic Signature): 04/27/2024 7:01 AM Signed By: Rozann Kail, MD   Medications Administered:  Medications  morphine 4 mg/mL injection 2 mg (2 mg Intramuscular Given  04/27/24 [id number])    Discharge Medications (Medications Prescribed during this  ED visit and Patient's Home Medications) :    Your Medication List    You have not been prescribed any medications.       Cherie Ardeen Hanger, MD 04/27/24 1038

## 2024-06-29 NOTE — ED Provider Notes (Signed)
 Emergency Department Provider Note    ED Clinical Impression   Final diagnoses:  Fall, initial encounter (Primary)  Right hip pain  Right leg pain  Abrasion of right lower leg, initial encounter  Cellulitis of left lower extremity    ED Assessment/Plan    Condition: Stable Disposition: Discharge  This chart has been completed using Dragon Medical Dictation software, and while attempts have been made to ensure accuracy, certain words and phrases may not be transcribed as intended.   History   Chief Complaint  Patient presents with  . Fall  . Leg Pain   HPI  Amanda Frank is a 89 y.o. female who presents today to the emergency department via EMS complaining of right hip and lower leg pain after a mechanical fall PTA.  She had pulled herself up and leaned out to grab an object, but misjudged the distance and fell down onto her right leg.  Denies any head injury, LOC, vomiting or seizures.  Her daughter arrived while in the ER and advised that she is taking Augmentin for the dog bite to her LLE and that she started the Augmentin on 06/26/2024.  Her daughter is also requesting to have her urine checked for a UTI.  Allergies: is allergic to sulfa (sulfonamide antibiotics), bisphosphonates, etodolac, and nsaids (non-steroidal anti-inflammatory drug). Medications: has a current medication list which includes the following long-term medication(s): famotidine, levothyroxine, sertraline, trazodone, and rosuvastatin. PMHx:  has a past medical history of Anxiety disorder, Disease of thyroid gland, and Vitamin D deficiency. PSHx:  has no past surgical history on file. SocHx:  reports that she has never smoked. She has never used smokeless tobacco. She reports that she does not drink alcohol and does not use drugs. Allergies, Medications, Medical, Surgical, and Social History were reviewed as documented  above.   Social Drivers of Health with Concerns   Food Insecurity: Not on file  Transportation Needs: Unknown (02/11/2024)   Received from [facility]   OASIS A1250: Transportation   . Lack of Transportation (Medical): Not on file   . Lack of Transportation (Non-Medical): Not on file   . Patient Unable or Declines to Respond: Yes  Alcohol Use: Not on file  Housing: Not on file  Physical Activity: Not on file  Utilities: Not on file  Stress: Not on file  Interpersonal Safety: Not on file  Substance Use: Not on file (04/27/2024)  Intimate Partner Violence: Not on file  Social Connections: Not on file  Financial Resource Strain: Not on file  Health Literacy: Unknown (02/11/2024)   Received from [facility]   OASIS B1300: Health Literacy   . Frequency of needing help to read materials from doctor or pharmacy: Patient unable to respond  Internet Connectivity: Not on file     Review Of Systems  Review of Systems  Constitutional:  Negative for chills and fever.  Respiratory:  Negative for shortness of breath.   Cardiovascular:  Negative for chest pain.  Gastrointestinal:  Negative for nausea and vomiting.  Musculoskeletal:        Right hip and lower leg pain  Skin:  Positive for wound.  All other systems reviewed and are negative.   Physical Exam   BP 114/68   Pulse 74   Temp 36.7 C (98 F)  Resp 14   Ht 157.5 cm (5' 2)   Wt 63.5 kg (140 lb 0.9 oz)   SpO2 97%   BMI 25.62 kg/m   Physical Exam Vitals and nursing note reviewed.  Constitutional:      General: She is not in acute distress.    Appearance: She is well-developed.  HENT:     Head: Normocephalic and atraumatic.  Cardiovascular:     Rate and Rhythm: Normal rate and regular rhythm.     Heart sounds: Normal heart sounds.  Pulmonary:     Effort: Pulmonary effort is normal.     Breath sounds: Normal breath sounds.  Skin:    General: Skin is warm and dry.     Capillary Refill: Capillary refill  takes less than 2 seconds.     Findings: Wound present.     Comments: 2 abrasions on right lower extremity  cellulitis present on left lower extremity  Neurological:     Mental Status: She is alert and oriented to person, place, and time.  Psychiatric:        Mood and Affect: Mood normal.        Behavior: Behavior normal.     ED Course  Medical Decision Making Reviewed and discussed x-ray and CT results.  Discussed hip x-ray and CT results with orthopedics, Dr. Heyward, who recommends discharge home and follow-up with [facility] as scheduled as long as she is able to tolerate weightbearing on her right leg.  Marked the cellulitis on the LLE with a skin marker; she will need to follow-up with her primary care for reevaluation to consider additional antibiotics or changing antibiotics.  She was able to tolerate weightbearing on her right leg and ambulated from the exam room using her walker with assistance by the nurse and paramedic; she is stable for discharge home at this time.  UA still pending; will await results, then treat if/as indicated and notify the staff at [facility].  Advised to continue current medications as previously prescribed, Tylenol, to keep the wound site clean and dry and to use her walker and get help when standing, walking, etc.  Follow-up with orthopedics as scheduled on Wednesday.  Follow-up with PCP.  I have reviewed my clinical findings and my clinical impression with the patient. The patient has expressed understanding that at this time there is no evidence for a more serious underlying process, but also understands that early in the process of a condition such as this, an initial workup can be falsely reassuring. I have counseled and discussed follow-up with the patient, stressing the importance of appropriate follow-up. I have also counseled the patient to return if symptoms worsen or if there are any concerns. Routine discharge counseling was given to the patient  and the patient understands that worsening, changing or persistent symptoms should prompt an immediate call or follow up with their primary physician or return to the emergency department for reevaluation. Patient has expressed understanding.  Amount and/or Complexity of Data Reviewed Labs: ordered. Decision-making details documented in ED Course. Radiology: ordered. Decision-making details documented in ED Course.  Risk OTC drugs. Prescription drug management. Decision regarding hospitalization. Diagnosis or treatment significantly limited by social determinants of health.     Procedures   No results found for this visit on 06/29/24 (from the past 4464 hours).   ED Results Results for orders placed or performed during the hospital encounter of 06/29/24  Urinalysis with Microscopy with Culture Reflex  Result Value Ref Range  Color, UA Colorless    Clarity, UA Clear Clear   Specific Gravity, UA 1.009 (L) 1.010 - 1.025   pH, UA 7.5 5.0 - 8.0   Leukocyte Esterase, UA Negative Negative   Nitrite, UA Negative Negative   Protein, UA Negative Negative   Glucose, UA Negative Negative, Trace   Ketones, UA Negative Negative   Urobilinogen, UA <2.0 mg/dL <7.9 mg/dL   Bilirubin, UA Negative Negative   Blood, UA Trace (A) Negative   RBC, UA 4 (H) 0 - 3 /HPF   WBC, UA 1 0 - 3 /HPF   Squam Epithel, UA <1 0 - 10 /HPF   Bacteria, UA None Seen None Seen /HPF   WBC Clumps None Seen None Seen /HPF   Hyphal Yeast None Seen None Seen /HPF   Yeast, UA None Seen None Seen /HPF   XR Tibia Fibula Left Result Date: 06/30/2024 Exam:  Left Tibia and Fibula  History:  Infection, dog bite Wednesday, 06/24/2024, leg swelling, pain  Technique:  3 views  Comparison:  None.  Findings:  Exams are limited due to technique. No acute bony abnormality or fracture is evident. No plain film evidence of osteomyelitis demonstrated.  Diffuse soft tissue swelling is noted in the left lower leg. No soft tissue gas or  radiopaque foreign body demonstrated. Dystrophic calcifications are noted    1. No acute bony abnormality. No plain film evidence of acute osteomyelitis or fracture  2. Soft tissue swelling, left lower leg. Negative for soft tissue gas or radiopaque foreign body      Signed (Electronic Signature): 06/30/2024 12:48 AM Signed By: Luke Batter  CT Pelvis Wo Contrast Result Date: 06/29/2024 ========================================= THIS IS A PRELIMINARY REPORT ONLY.  PLEASE REFER TO THE FORMAL, FINALIZED REPORT FOR THIS EXAM THAT WILL FOLLOW.  Exam:  Pelvic CT noncontrast  History:  Fell tonight, right hip pain  Comparison:  CT 04/27/2024    PRELIMINARY:   1. The acetabular component of the right hip prosthesis has entirely rotated clockwise/posteriorly with fractured anchor screw components remaining in the acetabulum  2. There is also progressive posterior displacement of the right acetabular component  3. Progressive proximal foreshortening/displacement of the femoral component now in contact with the iliac bone  4. Prosthesis bone interface widening also noted involving the left acetabular component  THIS IS A PRELIMINARY REPORT ONLY.  PLEASE REFER TO THE FORMAL, FINALIZED REPORT FOR THIS EXAM THAT WILL FOLLOW. =========================================    XR Hip 2 Views Right Result Date: 06/29/2024 Exam:  Right Hip   History:  Pain after fall  Technique:  3 views right hip and pelvis  Comparison:  04/27/2024 x-ray, 04/27/2024 CT of the right hip  Findings: Bilateral hip prostheses.  New significant subluxation of the right acetabular component with clockwise rotation since the prior CT suggests acetabular fractures, not well evaluated on plain film. Persistent abnormal prosthetic bone interface widening of the acetabular component.  Progressive foreshortening position of the right femur/femoral component relative to the acetabular component.   Left hip prosthesis is stable in appearance compared to prior  imaging. Remainder of the bony pelvis is negative for additional acute finding. Shunt tubing is again noted.    1. Significant progressive right hip malpositioned acetabular component suggesting acetabular fractures.  2. Progressive foreshortening positioning of the right femur/femoral component relative to the acetabulum    Signed (Electronic Signature): 06/29/2024 9:13 PM Signed By: Luke Batter  CT Cervical Spine Wo Contrast Result Date: 06/29/2024 Exam:  CT Cervical  Spine without Contrast  History:  Fall.  Technique: Routine cervical spine CT without IV contrast. AEC (automated exposure control) and/or manual techniques such as size-specific kV and mAs are employed where appropriate to reduce radiation exposure for all CT exams.  Comparison:  None.  Findings:   CERVICAL AND UPPER THORACIC SPINE:  Posterior approach fusion from C1 to C5.  No acute fracture.  No aggressive appearing osseous lesion.  Moderate disc space narrowing at C6-7.  There is 2.5 mm of anterolisthesis of C6 on C7.  There are mild bilateral facet degenerative changes at this level.  Partially visualized is moderate to severe degenerative disc disease at T2-3.  NECK SOFT TISSUES:  No prevertebral edema.  Posterior right neck VP shunt tubing catheter partially visualized.  SKULL BASE AND POSTERIOR FOSSA:  Negative.  LUNG APICES AND SUPERIOR MEDIASTINUM:  Mild bilateral dependent opacities which may represent atelectasis.    1. Negative for acute fracture. 2. Posterior approach fusion from C1 to C5. 3. 2.5 mm of anterolisthesis of C6 on C7. This is of uncertain chronicity.  Signed (Electronic Signature): 06/29/2024 9:09 PM Signed By: Korene Schneider, MD  XR Tibia Fibula Right Result Date: 06/29/2024 Exam:  Right Tibia and Fibula  History:  Pain after fall  Technique:  4 views right tibia and fibula  Comparison:  None.  Findings:  The bones are demineralized. Chondrocalcinosis is noted in the knee. Small knee joint effusion is evident with  dystrophic calcifications.  No acute fracture is evident in the right tibia or fibula.    Negative right tibia and fibula. No acute fracture. Chronic findings of osteopenia and chondrocalcinosis    Signed (Electronic Signature): 06/29/2024 9:08 PM Signed By: Luke Batter  CT Head Wo Contrast Result Date: 06/29/2024 Exam:  CT Head without Contrast  History:  Fall.  Technique: Routine brain CT without IV contrast. AEC (automated exposure control) and/or manual techniques such as size-specific kV and mAs are employed where appropriate to reduce radiation exposure for all CT exams.  Comparison:  None.  Findings:   BRAIN:  Right parietal approach ventriculostomy catheter terminating with the posterior aspect of the right lateral ventricle.  No acute intracranial hemorrhage, mass effect, midline shift or extra axial fluid collection.  Negative for CT evidence of acute large vessel territory area of infarction.  Mild cerebral atrophy which is not out of proportion for patient age.  Mild supratentorial white matter hypoattenuation which is likely secondary to chronic small vessel ischemic disease.  Vascular calcifications at the skull base.  SOFT TISSUES:  Negative. CALVARIUM:  Negative for acute fracture.  Right parietal approach ventricular catheter. SINUSES AND MASTOIDS:  No significant mucosal thickening or fluid.    1. Negative for acute intracranial process. 2. Right parietal approach ventriculostomy catheter.  Signed (Electronic Signature): 06/29/2024 9:02 PM Signed By: Carmelo Gullotto, MD   Medications Administered: Medications - No data to display  Discharge Medications (Medications Prescribed during this  ED visit and Patient's Home Medications) :    Your Medication List     ASK your doctor about these medications    amoxicillin-clavulanate 875-125 mg per tablet Commonly known as: AUGMENTIN Take 1 tablet by mouth two (2) times a day.   B complex-vitamin C-folic acid 0.8 mg Tab Take 1 tablet  by mouth daily.   buprenorphine 5 mcg/hour Ptwk transdermal patch Place 1 patch on the skin every seven (7) days.   CeleBREX 200 MG capsule Generic drug: celecoxib Take 1 capsule (200 mg total) by  mouth two (2) times a day.   cholecalciferol (vitamin D3-1,250 mcg (50,000 unit)) 1,250 mcg (50,000 unit) capsule Take 1 capsule (1,250 mcg total) by mouth once a week.   ciprofloxacin HCl 250 MG tablet Commonly known as: CIPRO Take 1 tablet (250 mg total) by mouth two (2) times a day.   docusate sodium 100 MG capsule Commonly known as: COLACE Take 4 capsules (400 mg total) by mouth daily.   doxycycline 50 MG capsule Commonly known as: MONODOX Take 1 capsule (50 mg total) by mouth two (2) times a day.   enoxaparin 40 mg/0.4 mL Syrg Commonly known as: LOVENOX   ergocalciferol-1,250 mcg (50,000 unit) 1,250 mcg (50,000 unit) capsule Commonly known as: DRISDOL Take 1 capsule (1,250 mcg total) by mouth once a week.   famotidine 20 MG tablet Commonly known as: PEPCID Take 1 tablet (20 mg total) by mouth.   HYDROcodone-acetaminophen 10-325 mg per tablet Commonly known as: NORCO 10-325 Take 1 tablet by mouth every six (6) hours as needed.   levothyroxine 112 MCG tablet Commonly known as: SYNTHROID Take 1 tablet (112 mcg total) by mouth daily.   meloxicam 7.5 MG tablet Commonly known as: MOBIC Take 1 tablet (7.5 mg total) by mouth daily.   nitrofurantoin 100 MG capsule Commonly known as: MACRODANTIN   oxyCODONE 20 mg Tr12 12 hr crush resistant ER/CR tablet Commonly known as: OxyCONTIN Take 1 tablet (20 mg total) by mouth every twelve (12) hours.   rosuvastatin 10 MG tablet Commonly known as: CRESTOR Take 1 tablet (10 mg total) by mouth daily.   sertraline 100 MG tablet Commonly known as: ZOLOFT Take 1 tablet (100 mg total) by mouth daily.   SODIUM BORATE MISC Take 1 tablet by mouth daily.   traZODone 50 MG tablet Commonly known as: DESYREL Take 0.5 tablets (25 mg  total) by mouth nightly.              Rockey Alyce Ditch, [place] 06/30/24 [phone]

## 2024-10-27 ENCOUNTER — Encounter (INDEPENDENT_AMBULATORY_CARE_PROVIDER_SITE_OTHER): Payer: Self-pay | Admitting: Ophthalmology

## 2024-11-05 ENCOUNTER — Encounter (INDEPENDENT_AMBULATORY_CARE_PROVIDER_SITE_OTHER): Admitting: Ophthalmology

## 2024-11-05 DIAGNOSIS — H353114 Nonexudative age-related macular degeneration, right eye, advanced atrophic with subfoveal involvement: Secondary | ICD-10-CM

## 2024-11-05 DIAGNOSIS — H353122 Nonexudative age-related macular degeneration, left eye, intermediate dry stage: Secondary | ICD-10-CM

## 2024-11-05 DIAGNOSIS — H43812 Vitreous degeneration, left eye: Secondary | ICD-10-CM
